# Patient Record
Sex: Male | Born: 1985 | Race: White | Hispanic: No | Marital: Single | Smoking: Current some day smoker
Health system: Southern US, Community
[De-identification: ages and names within clinical notes are randomized; demographics above are authoritative.]

---

## 2017-11-03 ENCOUNTER — Other Ambulatory Visit: Payer: Self-pay

## 2017-11-03 ENCOUNTER — Encounter (HOSPITAL_COMMUNITY): Payer: Self-pay | Admitting: *Deleted

## 2017-11-03 ENCOUNTER — Emergency Department (HOSPITAL_COMMUNITY): Payer: BLUE CROSS/BLUE SHIELD

## 2017-11-03 ENCOUNTER — Emergency Department (HOSPITAL_COMMUNITY)
Admission: EM | Admit: 2017-11-03 | Discharge: 2017-11-03 | Disposition: A | Discharge status: Home / Self Care | Payer: BLUE CROSS/BLUE SHIELD | Attending: Emergency Medicine | Admitting: Emergency Medicine

## 2017-11-03 DIAGNOSIS — R1013 Epigastric pain: Secondary | ICD-10-CM | POA: Insufficient documentation

## 2017-11-03 DIAGNOSIS — R112 Nausea with vomiting, unspecified: Secondary | ICD-10-CM | POA: Insufficient documentation

## 2017-11-03 DIAGNOSIS — R1010 Upper abdominal pain, unspecified: Secondary | ICD-10-CM | POA: Diagnosis present

## 2017-11-03 DIAGNOSIS — F17210 Nicotine dependence, cigarettes, uncomplicated: Secondary | ICD-10-CM | POA: Insufficient documentation

## 2017-11-03 LAB — COMPREHENSIVE METABOLIC PANEL
ALBUMIN: 4.3 g/dL (ref 3.5–5.0)
ALT: 27 U/L (ref 17–63)
ANION GAP: 13 (ref 5–15)
AST: 37 U/L (ref 15–41)
Alkaline Phosphatase: 81 U/L (ref 38–126)
BILIRUBIN TOTAL: 0.9 mg/dL (ref 0.3–1.2)
BUN: 11 mg/dL (ref 6–20)
CO2: 21 mmol/L — ABNORMAL LOW (ref 22–32)
Calcium: 9.3 mg/dL (ref 8.9–10.3)
Chloride: 105 mmol/L (ref 101–111)
Creatinine, Ser: 0.99 mg/dL (ref 0.61–1.24)
GFR calc Af Amer: >60 mL/min (ref >60)
GFR calc non Af Amer: >60 mL/min (ref >60)
GLUCOSE: 83 mg/dL (ref 65–99)
POTASSIUM: 3.8 mmol/L (ref 3.5–5.1)
SODIUM: 139 mmol/L (ref 135–145)
TOTAL PROTEIN: 7.4 g/dL (ref 6.5–8.1)

## 2017-11-03 LAB — CBC
HCT: 43.9 % (ref 39.0–52.0)
HEMOGLOBIN: 14.7 g/dL (ref 13.0–17.0)
MCH: 30.2 pg (ref 26.0–34.0)
MCHC: 33.5 g/dL (ref 30.0–36.0)
MCV: 90.3 fL (ref 78.0–100.0)
Platelets: 275 10*3/uL (ref 150–400)
RBC: 4.86 MIL/uL (ref 4.22–5.81)
RDW: 13.1 % (ref 11.5–15.5)
WBC: 8.1 10*3/uL (ref 4.0–10.5)

## 2017-11-03 LAB — URINALYSIS, ROUTINE W REFLEX MICROSCOPIC
BILIRUBIN URINE: NEGATIVE
GLUCOSE, UA: NEGATIVE mg/dL
Hgb urine dipstick: NEGATIVE
Ketones, ur: 5 mg/dL — AB
LEUKOCYTES UA: NEGATIVE
NITRITE: NEGATIVE
PH: 6 (ref 5.0–8.0)
Protein, ur: NEGATIVE mg/dL
SPECIFIC GRAVITY, URINE: 1.025 (ref 1.005–1.030)

## 2017-11-03 LAB — I-STAT CG4 LACTIC ACID, ED
LACTIC ACID, VENOUS: 1.25 mmol/L (ref 0.5–1.9)
Lactic Acid, Venous: 3.05 mmol/L (ref 0.5–1.9)

## 2017-11-03 LAB — LIPASE, BLOOD: LIPASE: 45 U/L (ref 11–51)

## 2017-11-03 MED ORDER — PANTOPRAZOLE SODIUM 40 MG IV SOLR
40.0000 mg | Freq: Once | INTRAVENOUS | Status: AC
  Administered 2017-11-03: 40 mg via INTRAVENOUS
  Filled 2017-11-03: qty 40

## 2017-11-03 MED ORDER — IOPAMIDOL (ISOVUE-300) INJECTION 61%
INTRAVENOUS | Status: AC
  Administered 2017-11-03: 100 mL
  Filled 2017-11-03: qty 100

## 2017-11-03 MED ORDER — MORPHINE SULFATE (PF) 4 MG/ML IV SOLN
4.0000 mg | Freq: Once | INTRAVENOUS | Status: AC
Start: 2017-11-03 — End: 2017-11-03
  Administered 2017-11-03: 4 mg via INTRAVENOUS
  Filled 2017-11-03: qty 1

## 2017-11-03 MED ORDER — SODIUM CHLORIDE 0.9 % IV BOLUS (SEPSIS)
1000.0000 mL | Freq: Once | INTRAVENOUS | Status: AC
  Administered 2017-11-03: 1000 mL via INTRAVENOUS

## 2017-11-03 MED ORDER — ONDANSETRON HCL 4 MG/2ML IJ SOLN
4.0000 mg | Freq: Once | INTRAMUSCULAR | Status: AC
  Administered 2017-11-03: 4 mg via INTRAVENOUS
  Filled 2017-11-03: qty 2

## 2017-11-03 MED ORDER — OMEPRAZOLE 20 MG PO CPDR: 20.0000 mg | DELAYED_RELEASE_CAPSULE | Freq: Every day | ORAL | 0 refills | Status: AC

## 2017-11-03 MED ORDER — GI COCKTAIL ~~LOC~~
30.0000 mL | Freq: Once | ORAL | Status: AC
  Administered 2017-11-03: 30 mL via ORAL
  Filled 2017-11-03: qty 30

## 2017-11-03 MED ORDER — ONDANSETRON 4 MG PO TBDP: 4.0000 mg | ORAL_TABLET | Freq: Three times a day (TID) | ORAL | 0 refills | Status: AC | PRN

## 2017-11-03 NOTE — ED Provider Notes (Signed)
[facility] EMERGENCY DEPARTMENT Provider Note   CSN: [id number] Arrival date & time: 11/03/17  0036     History   Chief Complaint Chief Complaint  Patient presents with  . Hernia    HPI Samuel Bass is a 32 y.o. male.  Patient presents with acute onset of upper abdominal pain with nausea and vomiting.  States he had severe upper abdominal pain wake him from sleep associated with one episode of vomiting streaks of blood.  Patient states he has "internal hernia" as well as an umbilical hernia and is seeing a Careers adviser in [place] for elective repair.  He is supposed to have another CT scan later this month.  He is never had this severe pain before.  Denies any diarrhea or fever.  Denies any previous abdominal surgeries.  No pain with urination or blood in the urine.  No chest pain or shortness of breath.   The history is provided by the patient.    History reviewed. No pertinent past medical history.  There are no active problems to display for this patient.   History reviewed. No pertinent surgical history.     Home Medications    Prior to Admission medications   Medication Sig Start Date End Date Taking? Authorizing Provider  ibuprofen (ADVIL,MOTRIN) 200 MG tablet Take 200 mg by mouth every 6 (six) hours as needed for mild pain or moderate pain.   Yes [provider]    Family History No family history on file.  Social History Social History   Tobacco Use  . Smoking status: Current Some Day Smoker  . Smokeless tobacco: Never Used  Substance Use Topics  . Alcohol use: Yes  . Drug use: No     Allergies   Patient has no known allergies.   Review of Systems Review of Systems  Constitutional: Positive for activity change, appetite change, diaphoresis and fatigue. Negative for fever.  HENT: Negative for congestion and rhinorrhea.   Respiratory: Negative for cough, chest tightness and shortness of breath.   Cardiovascular:  Negative for chest pain.  Gastrointestinal: Positive for abdominal pain, nausea and vomiting. Negative for diarrhea.  Genitourinary: Negative for hematuria and testicular pain.  Musculoskeletal: Negative for arthralgias and myalgias.  Neurological: Negative for dizziness, tremors and headaches.    all other systems are negative except as noted in the HPI and PMH.    Physical Exam Updated Vital Signs BP 133/81   Pulse 80   Temp 97.7 F (36.5 C) (Oral)   Resp (!) 24   SpO2 100%   Physical Exam  Constitutional: He is oriented to person, place, and time. He appears well-developed and well-nourished. He appears distressed.  HENT:  Head: Normocephalic and atraumatic.  Mouth/Throat: Oropharynx is clear and moist. No oropharyngeal exudate.  Eyes: Conjunctivae and EOM are normal. Pupils are equal, round, and reactive to light.  Neck: Normal range of motion. Neck supple.  No meningismus.  Cardiovascular: Normal rate, regular rhythm, normal heart sounds and intact distal pulses.  No murmur heard. Pulmonary/Chest: Effort normal and breath sounds normal. No respiratory distress.  Abdominal: Soft. There is tenderness. There is no rebound and no guarding.  Obese. No obvious hernias TTP LUQ and epigastrium Voluntary guarding in upper abdomen  Genitourinary:  Genitourinary Comments: No testicular tenderness  Musculoskeletal: Normal range of motion. He exhibits no edema or tenderness.  Neurological: He is alert and oriented to person, place, and time. No cranial nerve deficit. He exhibits normal muscle tone. Coordination normal.  5/5 strength throughout. CN 2-12 intact.Equal grip strength.   Skin: Skin is warm. Capillary refill takes less than 2 seconds. He is diaphoretic.  Psychiatric: He has a normal mood and affect. His behavior is normal.  Nursing note and vitals reviewed.    ED Treatments / Results  Labs (all labs ordered are listed, but only abnormal results are displayed) Labs  Reviewed  URINALYSIS, ROUTINE W REFLEX MICROSCOPIC - Abnormal; Notable for the following components:      Result Value   Ketones, ur 5 (*)    All other components within normal limits  I-STAT CG4 LACTIC ACID, ED - Abnormal; Notable for the following components:   Lactic Acid, Venous 3.05 (*)    All other components within normal limits  CBC  LIPASE, BLOOD  COMPREHENSIVE METABOLIC PANEL    EKG  EKG Interpretation None       Radiology Ct Abdomen Pelvis W Contrast  Result Date: 11/03/2017 CLINICAL DATA:  32 year old male with abdominal pain. Nausea and vomiting. EXAM: CT ABDOMEN AND PELVIS WITH CONTRAST TECHNIQUE: Multidetector CT imaging of the abdomen and pelvis was performed using the standard protocol following bolus administration of intravenous contrast. CONTRAST:  100mL ISOVUE-300 IOPAMIDOL (ISOVUE-300) INJECTION 61% COMPARISON:  None. FINDINGS: Lower chest: The visualized lung bases are clear. No intra-abdominal free air or free fluid. Hepatobiliary: Two subcentimeter right hepatic hypodense lesions are not well characterized but may represent cysts or hemangioma. No intrahepatic biliary ductal dilatation. The gallbladder is unremarkable. Pancreas: Unremarkable. No pancreatic ductal dilatation or surrounding inflammatory changes. Spleen: Normal in size without focal abnormality. Adrenals/Urinary Tract: Adrenal glands are unremarkable. Kidneys are normal, without renal calculi, focal lesion, or hydronephrosis. Bladder is unremarkable. Stomach/Bowel: Stomach is within normal limits. Appendix appears normal. No evidence of bowel wall thickening, distention, or inflammatory changes. Vascular/Lymphatic: No significant vascular findings are present. No enlarged abdominal or pelvic lymph nodes. Reproductive: The prostate and seminal vesicles are grossly unremarkable. Other: Small fat containing umbilical hernia. No associated inflammatory changes. Musculoskeletal: No acute or significant osseous  findings. IMPRESSION: 1. No acute intra-abdominal or pelvic pathology. No bowel obstruction or active inflammation. Normal appendix 2. Subcentimeter right hepatic hypodense lesions, not characterized on the CT, possibly cysts or hemangioma. Electronically Signed   By: Elgie CollardArash  Radparvar M.D.   On: 11/03/2017 02:43    Procedures Procedures (including critical care time)  Medications Ordered in ED Medications  morphine 4 MG/ML injection 4 mg (not administered)  ondansetron (ZOFRAN) injection 4 mg (not administered)  sodium chloride 0.9 % bolus 1,000 mL (not administered)     Initial Impression / Assessment and Plan / ED Course  I have reviewed the triage vital signs and the nursing notes.  Pertinent labs & imaging results that were available during my care of the patient were reviewed by me and considered in my medical decision making (see chart for details).    Patient with history of possible internal hernia and presenting with sudden onset of upper abdominal pain with nausea and vomiting.  He is uncomfortable and diaphoretic.  Vitals are stable.  Labs show elevated lactate.  Normal white blood cell count. LFTs and lipase normal.  CT scan is reassuring.  There is no evidence of any kind of internal hernia.   there is a fat-containing umbilical hernia  [facility]  [facility].  Records were attempted to be obtained but there were no CT scan  on file for this patient.  Patient feels improved.  He is tolerating p.o. with normal vital signs and a soft abdomen.  Lactate has normalized.  Reassured that CT scan today does not show any bowel obstruction or surgical emergency.  Instructed to keep his appointment with his surgeon, will start PPI and antiemetics. Avoid NSAIDs, caffeine, alcohol.  Return precautions discussed. Final Clinical Impressions(s) / ED Diagnoses   Final diagnoses:    Epigastric abdominal pain  Non-intractable vomiting with nausea, unspecified vomiting type    ED Discharge Orders    None       Jose Corvin, Jeannett Senior, MD 11/03/17 [phone]

## 2017-11-03 NOTE — ED Notes (Signed)
Taken to CT at this time. 

## 2017-11-03 NOTE — ED Triage Notes (Signed)
[place]. For the past 2 hours, pt has had severe abd pain with NV. Reports having another CT scan scheduled for later in the month.

## 2017-11-03 NOTE — Discharge Instructions (Signed)
As we discussed, your CT today is reassuring and there is no signs of a surgical emergency.  [place].  Take the stomach acid medication as prescribed.  Avoid medications such as aspirin ibuprofen or naproxen, alcohol, caffeine.  Return to the ED if you develop new or worsening symptoms.

## 2017-11-03 NOTE — ED Notes (Signed)
Dr Manus Gunningancour given a copy of lactic acid

## 2018-11-18 IMAGING — CT CT ABD-PELV W/ CM
2 of 4 series · 16 of 46 positions shown, 18 images · IV contrast (iopamidol)
Comparison: None.

CLINICAL DATA: 31-year-old male with abdominal pain. Nausea and
vomiting.

EXAM:
CT ABDOMEN AND PELVIS WITH CONTRAST
TECHNIQUE: Multidetector CT imaging of the abdomen and pelvis was performed
using the standard protocol following bolus administration of
intravenous contrast.
CONTRAST:  100mL K2YDT8-0CC IOPAMIDOL (K2YDT8-0CC) INJECTION 61%

[Series 3: abd/ pelvis 5.0 i30f 2 · axial · 0.98mm/px · z∈[+670,+1204]mm · 13 of 119 slices shown, 15 images]
[im 6/119  soft-tissue]
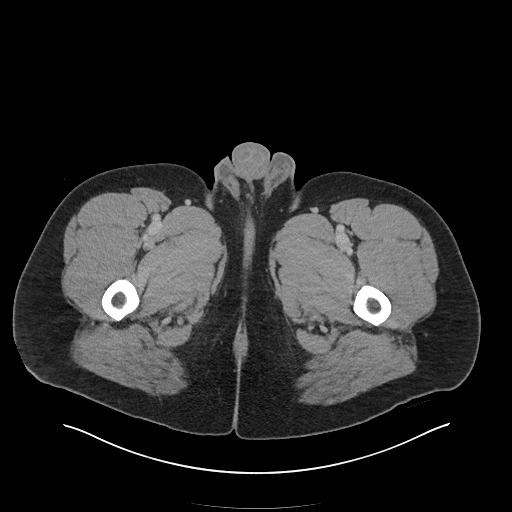
[im 6/119  bone]
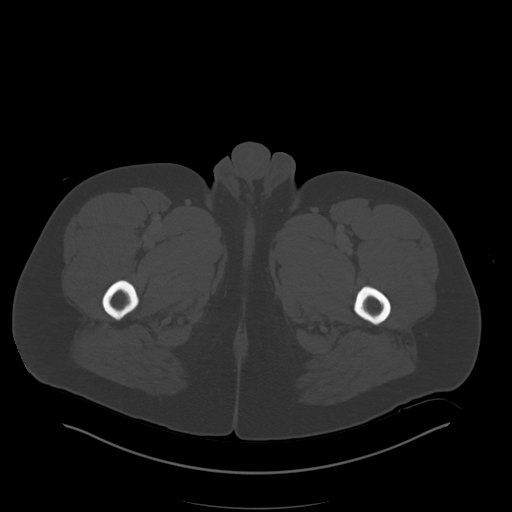
[im 16/119  soft-tissue]
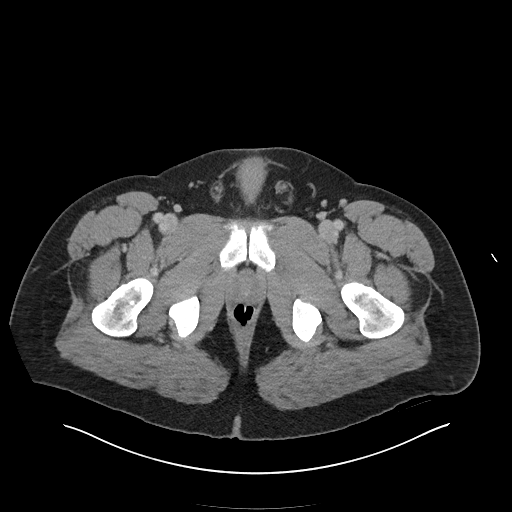
[im 26/119  soft-tissue]
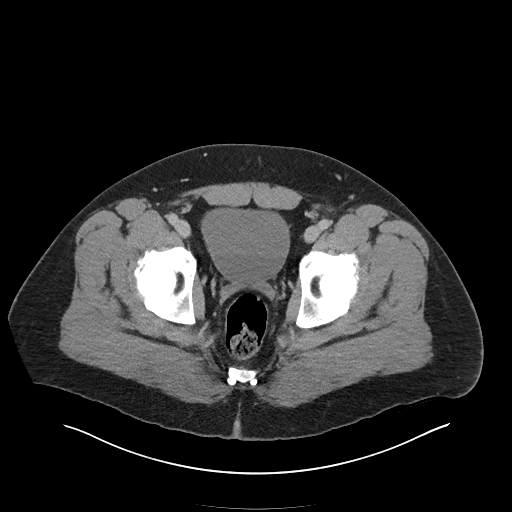
[im 31/119  soft-tissue]
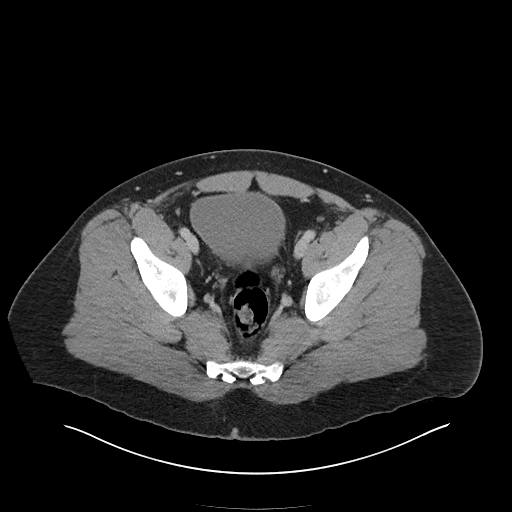
[im 42/119  soft-tissue]
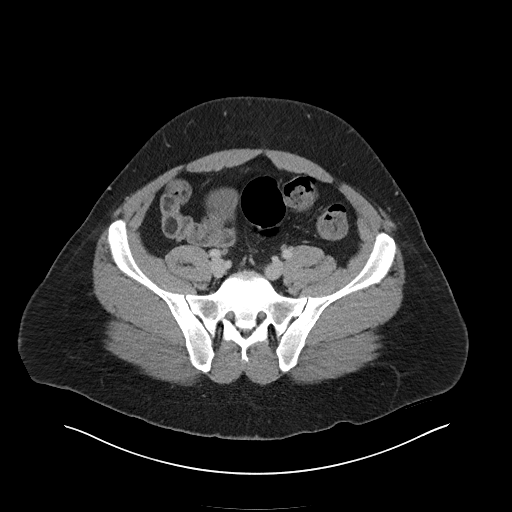
[im 52/119  soft-tissue]
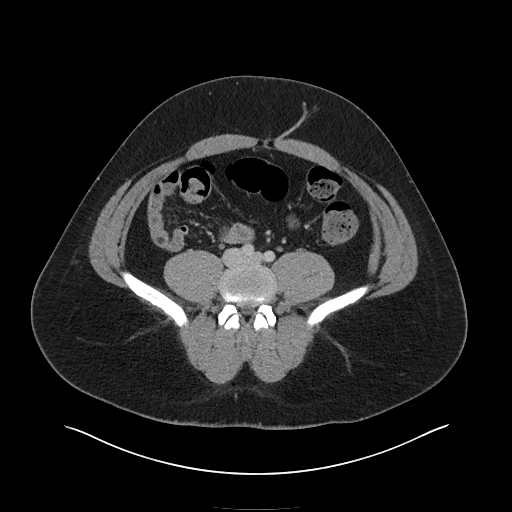
[im 62/119  soft-tissue]
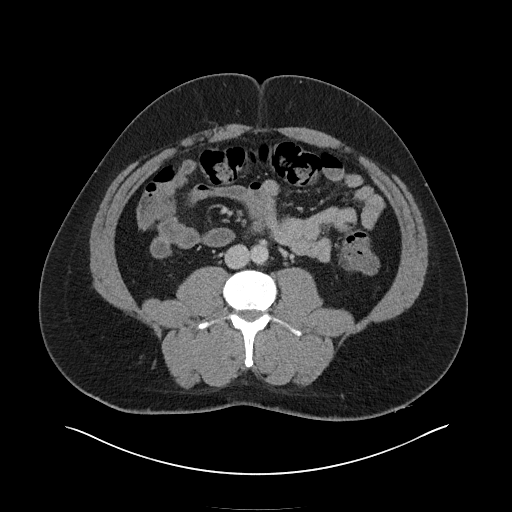
[im 67/119  soft-tissue]
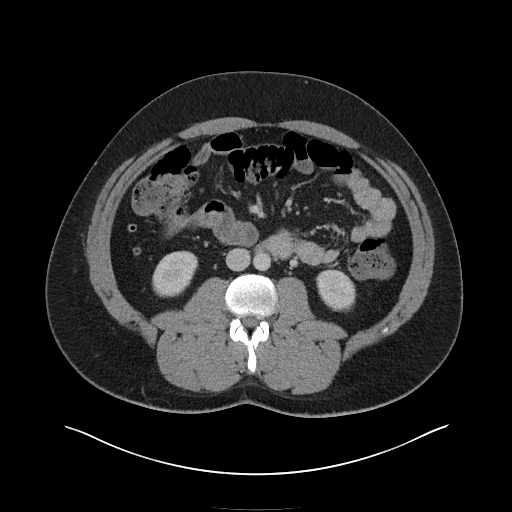
[im 77/119  soft-tissue]
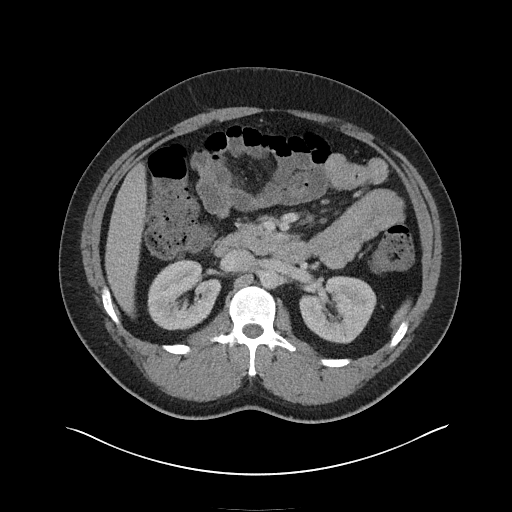
[im 77/119  bone]
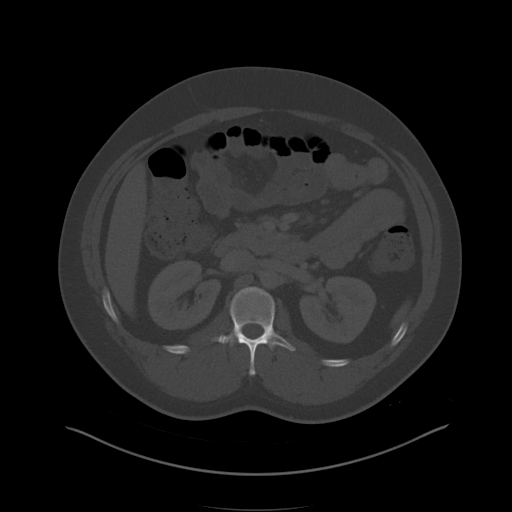
[im 88/119  soft-tissue]
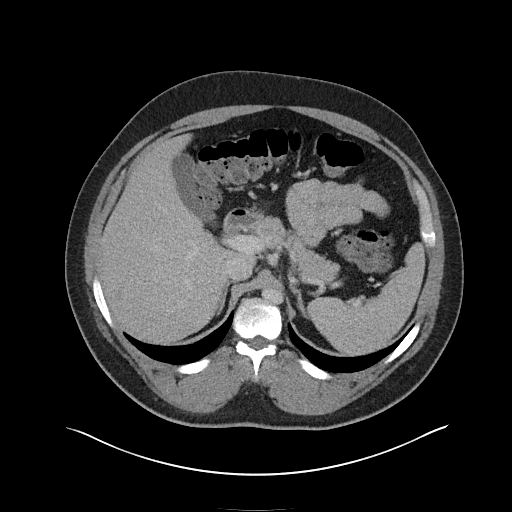
[im 93/119  soft-tissue]
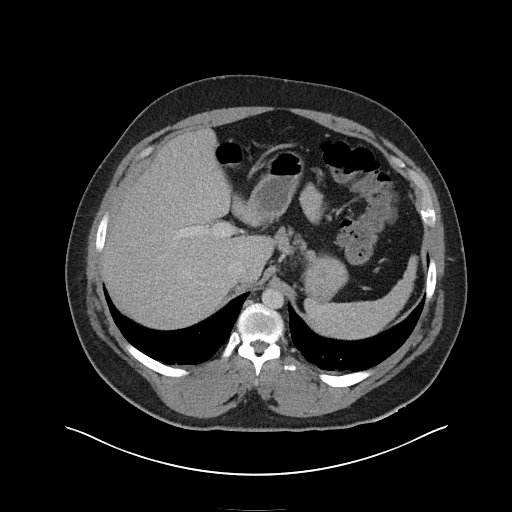
[im 103/119  soft-tissue]
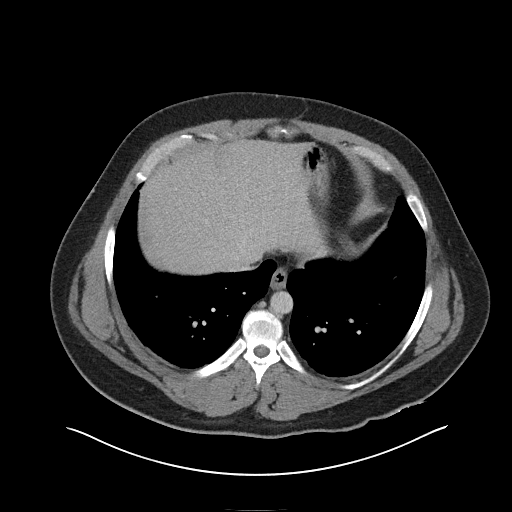
[im 113/119  soft-tissue]
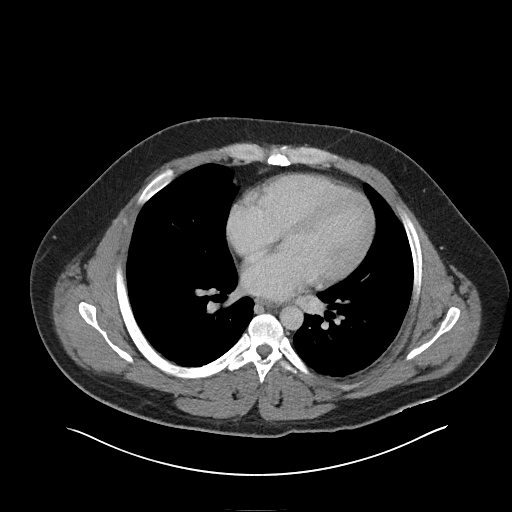

[Series 6: coronal soft tissue · coronal · 1.05mm/px · 3 of 101 slices shown]
[im 34/101  soft-tissue]
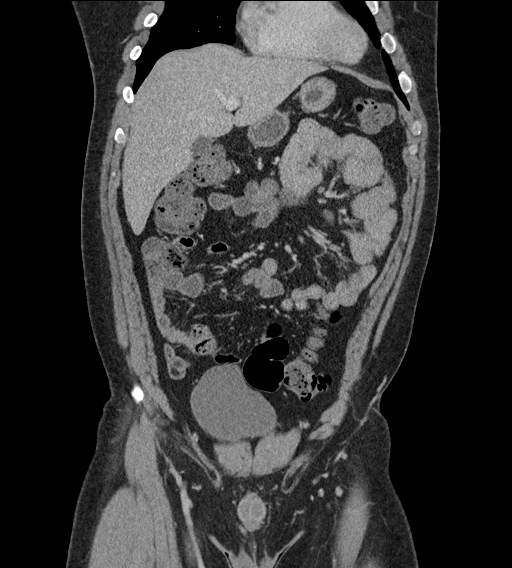
[im 45/101  soft-tissue]
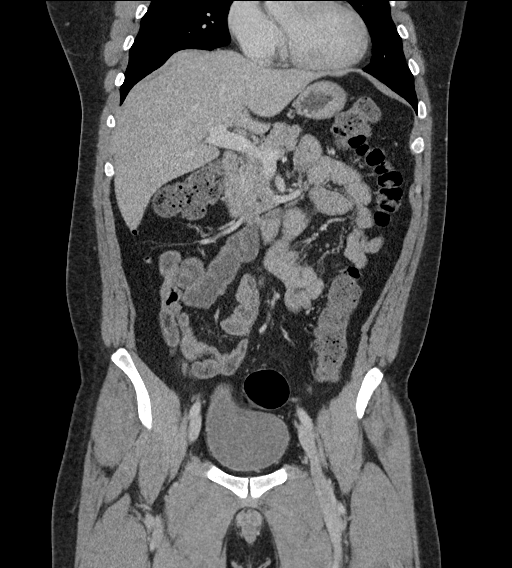
[im 56/101  soft-tissue]
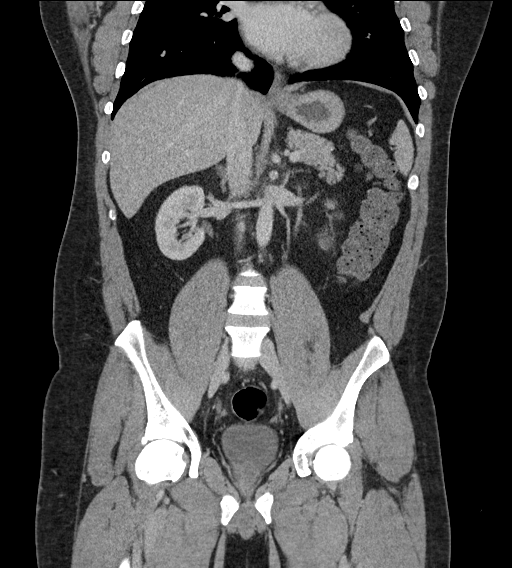

[16 of 46 positions shown; findings below may reference images not displayed]

FINDINGS: Lower chest: The visualized lung bases are clear.

No intra-abdominal free air or free fluid.

Hepatobiliary: Two subcentimeter right hepatic hypodense lesions are
not well characterized but may represent cysts or hemangioma. No
intrahepatic biliary ductal dilatation. The gallbladder is
unremarkable.

Pancreas: Unremarkable. No pancreatic ductal dilatation or
surrounding inflammatory changes.

Spleen: Normal in size without focal abnormality.

Adrenals/Urinary Tract: Adrenal glands are unremarkable. Kidneys are
normal, without renal calculi, focal lesion, or hydronephrosis.
Bladder is unremarkable.

Stomach/Bowel: Stomach is within normal limits. Appendix appears
normal. No evidence of bowel wall thickening, distention, or
inflammatory changes.

Vascular/Lymphatic: No significant vascular findings are present. No
enlarged abdominal or pelvic lymph nodes.

Reproductive: The prostate and seminal vesicles are grossly
unremarkable.

Other: Small fat containing umbilical hernia. No associated
inflammatory changes.

Musculoskeletal: No acute or significant osseous findings.
IMPRESSION: 1. No acute intra-abdominal or pelvic pathology. No bowel
obstruction or active inflammation. Normal appendix
2. Subcentimeter right hepatic hypodense lesions, not characterized
on the CT, possibly cysts or hemangioma.
